# Patient Record
Sex: Male | Born: 2000 | Race: Black or African American | Hispanic: No | Marital: Single | Smoking: Never smoker
Health system: Southern US, Community
[De-identification: ages and names within clinical notes are randomized; demographics above are authoritative.]

---
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
## 2022-04-19 DIAGNOSIS — H5213 Myopia, bilateral: Secondary | ICD-10-CM | POA: Diagnosis not present
